# Patient Record
Sex: Male | Born: 1990 | Race: Black or African American | Hispanic: No | Marital: Single | Smoking: Current every day smoker
Health system: Southern US, Community
[De-identification: ages and names within clinical notes are randomized; demographics above are authoritative.]

---
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
## 2012-04-21 ENCOUNTER — Encounter (HOSPITAL_COMMUNITY): Payer: Self-pay | Admitting: *Deleted

## 2012-04-21 ENCOUNTER — Emergency Department (HOSPITAL_COMMUNITY)
Admission: EM | Admit: 2012-04-21 | Discharge: 2012-04-21 | Disposition: A | Discharge status: Home / Self Care | Payer: 59 | Attending: Emergency Medicine | Admitting: Emergency Medicine

## 2012-04-21 DIAGNOSIS — F17200 Nicotine dependence, unspecified, uncomplicated: Secondary | ICD-10-CM | POA: Insufficient documentation

## 2012-04-21 DIAGNOSIS — J029 Acute pharyngitis, unspecified: Secondary | ICD-10-CM | POA: Insufficient documentation

## 2012-04-21 MED ORDER — AMOXICILLIN 500 MG PO CAPS: 500.0000 mg | ORAL_CAPSULE | Freq: Three times a day (TID) | ORAL | Status: AC

## 2012-04-21 NOTE — ED Notes (Signed)
Reports having sore throat x 1 week, airway intact at triage.

## 2012-04-21 NOTE — Discharge Instructions (Signed)
Take ibuprofen or Tylenol as needed for pain. See the Dr. of your choice for problems.   Antibiotic Nonuse  Your caregiver felt that the infection or problem was not one that would be helped with an antibiotic. Infections may be caused by viruses or bacteria. Only a caregiver can tell which one of these is the likely cause of an illness. A cold is the most common cause of infection in both adults and children. A cold is a virus. Antibiotic treatment will have no effect on a viral infection. Viruses can lead to many lost days of work caring for sick children and many missed days of school. Children may catch as many as 10 "colds" or "flus" per year during which they can be tearful, cranky, and uncomfortable. The goal of treating a virus is aimed at keeping the ill person comfortable. Antibiotics are medications used to help the body fight bacterial infections. There are relatively few types of bacteria that cause infections but there are hundreds of viruses. While both viruses and bacteria cause infection they are very different types of germs. A viral infection will typically go away by itself within 7 to 10 days. Bacterial infections may spread or get worse without antibiotic treatment. Examples of bacterial infections are:  Sore throats (like strep throat or tonsillitis).   Infection in the lung (pneumonia).   Ear and skin infections.  Examples of viral infections are:  Colds or flus.   Most coughs and bronchitis.   Sore throats not caused by Strep.   Runny noses.  It is often best not to take an antibiotic when a viral infection is the cause of the problem. Antibiotics can kill off the helpful bacteria that we have inside our body and allow harmful bacteria to start growing. Antibiotics can cause side effects such as allergies, nausea, and diarrhea without helping to improve the symptoms of the viral infection. Additionally, repeated uses of antibiotics can cause bacteria inside of our  body to become resistant. That resistance can be passed onto harmful bacterial. The next time you have an infection it may be harder to treat if antibiotics are used when they are not needed. Not treating with antibiotics allows our own immune system to develop and take care of infections more efficiently. Also, antibiotics will work better for [place] when they are prescribed for bacterial infections. Treatments for a child that is ill may include:  Give extra fluids throughout the day to stay hydrated.   Get plenty of rest.   Only give your child over-the-counter or prescription medicines for pain, discomfort, or fever as directed by your caregiver.   The use of a cool mist humidifier may help stuffy noses.   Cold medications if suggested by your caregiver.  Your caregiver may decide to start you on an antibiotic if:  The problem you were seen for today continues for a longer length of time than expected.   You develop a secondary bacterial infection.  SEEK MEDICAL CARE IF:  Fever lasts longer than 5 days.   Symptoms continue to get worse after 5 to 7 days or become severe.   Difficulty in breathing develops.   Signs of dehydration develop (poor drinking, rare urinating, dark colored urine).   Changes in behavior or worsening tiredness (listlessness or lethargy).  Document Released: 01/08/2002 Document Revised: 10/19/2011 Document Reviewed: 07/07/2009 [facility] Patient Information 2012 [place], [place].Pharyngitis, Viral and Bacterial Pharyngitis is soreness (inflammation) or infection of the pharynx. It is also called a sore throat.  CAUSES  Most sore throats are caused by viruses and are part of a cold. However, some sore throats are caused by strep and other bacteria. Sore throats can also be caused by post nasal drip from draining sinuses, allergies and sometimes from sleeping with an open mouth. Infectious sore throats can be spread from person to person by coughing, sneezing and  sharing cups or eating utensils. TREATMENT  Sore throats that are viral usually last 3-4 days. Viral illness will get better without medications (antibiotics). Strep throat and other bacterial infections will usually begin to get better about 24-48 hours after you begin to take antibiotics. HOME CARE INSTRUCTIONS   If the caregiver feels there is a bacterial infection or if there is a positive strep test, they will prescribe an antibiotic. The full course of antibiotics must be taken. If the full course of antibiotic is not taken, you or your child may become ill again. If you or your child has strep throat and do not finish all of the medication, serious heart or kidney diseases may develop.   Drink enough water and fluids to keep your urine clear or pale yellow.   Only take over-the-counter or prescription medicines for pain, discomfort or fever as directed by your caregiver.   Get lots of rest.   Gargle with salt water ( tsp. of salt in a glass of water) as often as every 1-2 hours as you need for comfort.   Hard candies may soothe the throat if individual is not at risk for choking. Throat sprays or lozenges may also be used.  SEEK MEDICAL CARE IF:   Large, tender lumps in the neck develop.   A rash develops.   Green, yellow-brown or bloody sputum is coughed up.   Your baby is older than 3 months with a rectal temperature of 100.5 F (38.1 C) or higher for more than 1 day.  SEEK IMMEDIATE MEDICAL CARE IF:   A stiff neck develops.   You or your child are drooling or unable to swallow liquids.   You or your child are vomiting, unable to keep medications or liquids down.   You or your child has severe pain, unrelieved with recommended medications.   You or your child are having difficulty breathing (not due to stuffy nose).   You or your child are unable to fully open your mouth.   You or your child develop redness, swelling, or severe pain anywhere on the neck.   You have  a fever.   Your baby is older than 3 months with a rectal temperature of 102 F (38.9 C) or higher.   Your baby is 54 months old or younger with a rectal temperature of 100.4 F (38 C) or higher.  MAKE SURE YOU:   Understand these instructions.   Will watch your condition.   Will get help right away if you are not doing well or get worse.  Document Released: 10/30/2005 Document Revised: 10/19/2011 Document Reviewed: 01/27/2008 [facility] Patient Information 2012 [place], [place].  RESOURCE GUIDE  Chronic Pain Problems: Contact [facility]  [phone] Patients need to be referred by their primary care doctor.  Insufficient Money for Medicine: Contact United Way:  call "211" or [facility] 626-092-6447.  No Primary Care Doctor: - Call [facility]  [phone] - can help you locate a primary care doctor that  accepts your insurance, provides certain services, etc. - Physician Referral [phone]  Agencies that provide inexpensive medical care: -  [facility]  [phone] - [facility]  [phone] - [facility]  [phone] [facility]  [phone] - [facility] Parenthood  [phone] [facility]  [phone]  Medicaid-accepting [facility] Providers: - [facility]- [address]  [phone], Mon-Fri 9am-7pm, Sat 9am-1pm - [facility]- [address] [place], [address] [place]  [phone] - [facility]- [address], [address] [place]  [phone] [facility]- [address]  [phone] - Renaye Rakers- [address] [place], [address], [phone]  Only accepts [place] patients after they have their name  applied to their card  Self Pay (no insurance) in [place]: - Sickle Cell Patients: Dr Willey Blade, [facility]  [address] [place], [phone] - [facility]- [address] [place]  [phone]       [facility]- [address]       -     [facility]- see information above (Speak to [employer] if you do not have insurance)       -  [facility]- [address] [place], [phone]       -  [facility]- [address],  [phone]       -  [facility]- [address] [phone]       -  Dr Julio Sicks-  [address], [place], [phone]       -  [facility]- [address] [phone]       -  [facility]- [address] [phone], also [address], [phone]       -    [facility]- [address] [place], [phone], 1st & 3rd Saturday   every month, 10am-1pm  1) Find a Doctor and Pay Out of Pocket Although you won't have to find out who is covered by your insurance plan, it is a good idea to ask around and get recommendations. You will then need to call the office and see if the doctor you have chosen will accept you as a new patient and what types of options they offer for patients who are self-pay. Some doctors offer discounts or will set up payment plans for their patients who do not have insurance, but you will need to ask so you aren't surprised when you get to your appointment.  2) Contact Your Local Health Department Not all health departments have doctors that can see patients for sick visits, but many do, so it is worth a call to see if yours does. If you don't know where your local health department is, you can check in your phone book. The CDC also has a tool to help you locate your state's health department, and many state websites also have listings of all of their local health departments.  3) Find a Walk-in Clinic If your illness is not likely to be very severe or complicated, you may want to try a walk in clinic. These are popping up all over the country in pharmacies, drugstores, and shopping centers. They're usually  staffed by nurse practitioners or physician assistants that have been trained to treat common illnesses and complaints. They're usually fairly quick and inexpensive. However, if you have serious medical issues  or chronic medical problems, these are probably not your best option  STD Testing - [facility] Department of [facility], STD Clinic, [address], [place], phone [phone] or [phone].  Monday - Friday, call for an appointment. [facility] Department of [employer], STD Clinic, [address], [place], phone [phone] or [phone].  Monday - Friday, call for an appointment.  Abuse/Neglect: [facility] Child Abuse Hotline (713)529-9474 [facility] Child Abuse Hotline (909)424-6735 (After Hours)  Emergency Shelter:  [facility] 7093846377  Maternity Homes: - Room at the [place] [facility] [phone] - [facility] (316)773-0325  MRSA Hotline #:   782-735-4140  [facility] Resources  [facility]  [employer] [facility] Dept. [address]                 [address]         [address] [place] [address]  Blondell Reveal Phone:  [phone]                                  Phone:  [phone]                   Phone:  [phone]  [facility], [phone] - [facility] - [phone]       -     [facility] in [place], [address],                                  ([phone], [facility] Child Abuse Hotline 314 751 7289 or (731) 032-5846 (After Hours)   Behavioral Health Services  Substance Abuse Resources: - [facility]  223-097-5792 - [facility] [phone] - [facility] [phone]) [phone] Floydene Flock  ([phone] - Residential & Outpatient Substance Abuse Program  6828199838  Psychological Services: [facility]  [phone] Services  [phone] - [facility], [phone] [place]. [address] [place], ACCESS LINE: 629-787-4054 or 780-409-4329, [employer]  Dental Assistance  If unable to pay or uninsured, contact:  [facility] or [facility]. to become qualified for the adult dental clinic.  Patients with Medicaid: [facility] [phone] [address], [phone] [address], [phone]  If unable to pay, or uninsured, contact [facility] (819) [phone]) or [facility] Department [phone] in [place], [phone] in [facility]) to become qualified for the adult dental clinic  Other Low-Cost Community Dental Services: - [facility]- [address] [place], [place], [place], [address], [phone], [address]. [phone], 2nd and 4th Thursday of the month at 6:30am.  10 clients each day by appointment, can sometimes see walk-in patients if someone does not show for an appointment. [facility]- [address] [place], [place], [address], [phone] - [facility] [address], [place], [place], [address], [phone] - [facility]- [phone] [facility] Department- [phone] [facility] Department- [phone]

## 2012-04-21 NOTE — ED Provider Notes (Signed)
History   This chart was scribed for Flint Melter, MD scribed by Magnus Sinning. The patient was seen in room STRE4/STRE4 seen  12:43   CSN: [id number]  Arrival date & time 04/21/12  1122   None     Chief Complaint  Patient presents with  . Sore Throat    (Consider location/radiation/quality/duration/timing/severity/associated sxs/prior treatment) HPI Corey Watkins is a 21 y.o. male who presents to the Emergency Department complaining of gradually worsening intermittent moderate ST with associated neck pain, fever, runny nose, and clear yellow-sputum production. Pt says ST it was mild before, but that it has worsened today. Pt states he has taken ibuprofen with temporary mild relief. Denies difficultty breathing, and otalgias.  History reviewed. No pertinent past medical history.  History reviewed. No pertinent past surgical history.  History reviewed. No pertinent family history.  History  Substance Use Topics  . Smoking status: Current Everyday Smoker    Types: Cigarettes  . Smokeless tobacco: Not on file  . Alcohol Use: Yes     occ      Review of Systems 10 Systems reviewed and are negative for acute change except as noted in the HPI. Allergies  Review of patient's allergies indicates no known allergies.  Home Medications   Current Outpatient Rx  Name Route Sig Dispense Refill  . IBUPROFEN 200 MG PO TABS Oral Take 800 mg by mouth every 6 (six) hours as needed. For pain    . AMOXICILLIN 500 MG PO CAPS Oral Take 1 capsule (500 mg total) by mouth 3 (three) times daily. 21 capsule 0    BP 114/56  Pulse 75  Temp(Src) 98.4 F (36.9 C) (Oral)  Resp 18  SpO2 100%  Physical Exam  Nursing note and vitals reviewed. Constitutional: He is oriented to person, place, and time. He appears well-developed and well-nourished. No distress.  HENT:  Head: Normocephalic and atraumatic.  Right Ear: Tympanic membrane normal.  Left Ear: Tympanic membrane normal.   Bilateral tonsil hypertrophy with exudate on left tonsil No peritonsillar swelling.  Eyes: Conjunctivae and EOM are normal.  Neck: Neck supple. No tracheal deviation present.  Cardiovascular: Normal rate.   Pulmonary/Chest: Effort normal. No respiratory distress.  Musculoskeletal: Normal range of motion.  Neurological: He is alert and oriented to person, place, and time.  Skin: Skin is warm and dry.  Psychiatric: He has a normal mood and affect. His behavior is normal.    ED Course  Procedures (including critical care time) DIAGNOSTIC STUDIES: Oxygen Saturation is 100% on room air, normal by my interpretation.    COORDINATION OF CARE:   Labs Reviewed  RAPID STREP SCREEN   No results found.   1. Pharyngitis       MDM  Pharyngitis with likely, infection, not clearly strep. He has been symptomatic for 1 week. We'll cover with antibiotics for occult bacterial infection. Doubt metabolic instability, serious bacterial infection or impending vascular collapse; the patient is stable for discharge.   I personally performed the services described in this documentation, which was scribed in my presence. The recorded information has been reviewed and considered.    Plan: Home Medications- Amoxicillin; Home Treatments- Advil; Recommended follow up- PCP of  Choice prn        Flint Melter, MD 04/21/12 [name]

## 2015-07-03 ENCOUNTER — Encounter (HOSPITAL_COMMUNITY): Payer: Self-pay | Admitting: *Deleted

## 2015-07-03 ENCOUNTER — Emergency Department (HOSPITAL_COMMUNITY)
Admission: EM | Admit: 2015-07-03 | Discharge: 2015-07-03 | Disposition: A | Discharge status: Home / Self Care | Payer: No Typology Code available for payment source | Attending: Emergency Medicine | Admitting: Emergency Medicine

## 2015-07-03 DIAGNOSIS — Y998 Other external cause status: Secondary | ICD-10-CM | POA: Diagnosis not present

## 2015-07-03 DIAGNOSIS — T148XXA Other injury of unspecified body region, initial encounter: Secondary | ICD-10-CM

## 2015-07-03 DIAGNOSIS — Y92410 Unspecified street and highway as the place of occurrence of the external cause: Secondary | ICD-10-CM | POA: Diagnosis not present

## 2015-07-03 DIAGNOSIS — S199XXA Unspecified injury of neck, initial encounter: Secondary | ICD-10-CM | POA: Diagnosis present

## 2015-07-03 DIAGNOSIS — S161XXA Strain of muscle, fascia and tendon at neck level, initial encounter: Secondary | ICD-10-CM | POA: Insufficient documentation

## 2015-07-03 DIAGNOSIS — Y9389 Activity, other specified: Secondary | ICD-10-CM | POA: Diagnosis not present

## 2015-07-03 DIAGNOSIS — Z720 Tobacco use: Secondary | ICD-10-CM | POA: Diagnosis not present
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
## 2015-07-03 NOTE — ED Notes (Signed)
Declined W/C at D/C and was escorted to lobby by RN. 

## 2016-05-31 ENCOUNTER — Encounter (HOSPITAL_COMMUNITY): Payer: Self-pay | Admitting: Emergency Medicine

## 2016-05-31 ENCOUNTER — Emergency Department (HOSPITAL_COMMUNITY)
Admission: EM | Admit: 2016-05-31 | Discharge: 2016-05-31 | Disposition: A | Discharge status: Home / Self Care | Payer: Self-pay | Attending: Emergency Medicine | Admitting: Emergency Medicine

## 2016-05-31 ENCOUNTER — Emergency Department (HOSPITAL_COMMUNITY): Payer: Self-pay

## 2016-05-31 DIAGNOSIS — F17210 Nicotine dependence, cigarettes, uncomplicated: Secondary | ICD-10-CM | POA: Insufficient documentation

## 2016-05-31 DIAGNOSIS — R053 Chronic cough: Secondary | ICD-10-CM

## 2016-05-31 DIAGNOSIS — R05 Cough: Secondary | ICD-10-CM

## 2016-05-31 DIAGNOSIS — J40 Bronchitis, not specified as acute or chronic: Secondary | ICD-10-CM | POA: Insufficient documentation

## 2016-05-31 MED ORDER — ALBUTEROL SULFATE HFA 108 (90 BASE) MCG/ACT IN AERS
2.0000 | INHALATION_SPRAY | RESPIRATORY_TRACT | Status: DC
  Administered 2016-05-31: 2 via RESPIRATORY_TRACT
  Filled 2016-05-31: qty 6.7

## 2016-05-31 NOTE — ED Provider Notes (Signed)
CSN: [id number]     Arrival date & time 05/31/16  0002 History  By signing my name below, I, Bridgette Habermann, attest that this documentation has been prepared under the direction and in the presence of Azalia Bilis, MD. Electronically Signed: Bridgette Habermann, ED Scribe. 05/31/2016. 2:52 AM.   Chief Complaint  Patient presents with  . Cough   The history is provided by the patient. No language interpreter was used.   HPI Comments: Corey Watkins is a 25 y.o. male who presents to the Emergency Department complaining of sudden onset, persistent productive cough onset one week ago. Pt also has associated chest congestion, nasal congestion and headache. No alleviating factors noted. Pt has no h/o asthma. Pt denies difficulty breathing, fever and chills.   History reviewed. No pertinent past medical history. History reviewed. No pertinent past surgical history. No family history on file. Social History  Substance Use Topics  . Smoking status: Current Every Day Smoker    Types: Cigarettes  . Smokeless tobacco: None  . Alcohol Use: Yes     Comment: occ    Review of Systems  A complete 10 system review of systems was obtained and all systems are negative except as noted in the HPI and PMH.   Allergies  Review of patient's allergies indicates no known allergies.  Home Medications   Prior to Admission medications   Not on File   BP 126/54 mmHg  Pulse 60  Temp(Src) 98.5 F (36.9 C) (Oral)  Resp 16  Ht  (1.753 m)  Wt 158 lb (71.668 kg)  BMI 23.32 kg/m2  SpO2 97% Physical Exam  Constitutional: He is oriented to person, place, and time. He appears well-developed and well-nourished.  HENT:  Head: Normocephalic and atraumatic.  Eyes: EOM are normal.  Neck: Normal range of motion.  Cardiovascular: Normal rate, regular rhythm, normal heart sounds and intact distal pulses.   Pulmonary/Chest: Effort normal and breath sounds normal. No respiratory distress.  Abdominal: Soft. He exhibits no  distension. There is no tenderness.  Musculoskeletal: Normal range of motion.  Neurological: He is alert and oriented to person, place, and time.  Skin: Skin is warm and dry.  Psychiatric: He has a normal mood and affect. Judgment normal.  Nursing note and vitals reviewed.   ED Course  Procedures  DIAGNOSTIC STUDIES: Oxygen Saturation is 97% on RA, adequate by my interpretation.    COORDINATION OF CARE: 2:51 AM Discussed treatment plan with pt at bedside which includes Rx inhaler and pt agreed to plan.  Labs Review Labs Reviewed - No data to display  Imaging Review Dg Chest 2 View  05/31/2016  CLINICAL DATA:  Persistent cough.  Congestion for a few days EXAM: CHEST  2 VIEW COMPARISON:  None. FINDINGS: Normal heart size and mediastinal contours. No acute infiltrate or edema. No effusion or pneumothorax. No acute osseous findings. IMPRESSION: Negative chest. Electronically Signed   By: Marnee Spring M.D.   On: 05/31/2016 00:55   I have personally reviewed and evaluated these images and lab results as part of my medical decision-making.   EKG Interpretation None      MDM   Final diagnoses:  Bronchitis    Well-appearing.  Vital signs normal.  Chest x-ray without pneumonia.  Suspect viral bronchitis.  Discharge home with albuterol for cough.  I personally performed the services described in this documentation, which was scribed in my presence. The recorded information has been reviewed and is accurate.  Azalia BilisKevin Tekesha Almgren, MD 05/31/16 [phone]

## 2016-05-31 NOTE — ED Notes (Addendum)
Pt. reports persistent productive cough , chest congestion , headache and nasal congestion onset last week , no fever or chills , respirations unlabored.

## 2016-05-31 NOTE — Discharge Instructions (Signed)
Upper Respiratory Infection, Adult Most upper respiratory infections (URIs) are a viral infection of the air passages leading to the lungs. A URI affects the nose, throat, and upper air passages. The most common type of URI is nasopharyngitis and is typically referred to as "the common cold." URIs run their course and usually go away on their own. Most of the time, a URI does not require medical attention, but sometimes a bacterial infection in the upper airways can follow a viral infection. This is called a secondary infection. Sinus and middle ear infections are common types of secondary upper respiratory infections. Bacterial pneumonia can also complicate a URI. A URI can worsen asthma and chronic obstructive pulmonary disease (COPD). Sometimes, these complications can require emergency medical care and may be life threatening.  CAUSES Almost all URIs are caused by viruses. A virus is a type of germ and can spread from one person to another.  RISKS FACTORS You may be at risk for a URI if:   You smoke.   You have chronic heart or lung disease.  You have a weakened defense (immune) system.   You are very young or very old.   You have nasal allergies or asthma.  You work in crowded or poorly ventilated areas.  You work in health care facilities or schools. SIGNS AND SYMPTOMS  Symptoms typically develop 2-3 days after you come in contact with a cold virus. Most viral URIs last 7-10 days. However, viral URIs from the influenza virus (flu virus) can last 14-18 days and are typically more severe. Symptoms may include:   Runny or stuffy (congested) nose.   Sneezing.   Cough.   Sore throat.   Headache.   Fatigue.   Fever.   Loss of appetite.   Pain in your forehead, behind your eyes, and over your cheekbones (sinus pain).  Muscle aches.  DIAGNOSIS  Your health care provider may diagnose a URI by:  Physical exam.  Tests to check that your symptoms are not due to  another condition such as:  Strep throat.  Sinusitis.  Pneumonia.  Asthma. TREATMENT  A URI goes away on its own with time. It cannot be cured with medicines, but medicines may be prescribed or recommended to relieve symptoms. Medicines may help:  Reduce your fever.  Reduce your cough.  Relieve nasal congestion. HOME CARE INSTRUCTIONS   Take medicines only as directed by your health care provider.   Gargle warm saltwater or take cough drops to comfort your throat as directed by your health care provider.  Use a warm mist humidifier or inhale steam from a shower to increase air moisture. This may make it easier to breathe.  Drink enough fluid to keep your urine clear or pale yellow.   Eat soups and other clear broths and maintain good nutrition.   Rest as needed.   Return to work when your temperature has returned to normal or as your health care provider advises. You may need to stay home longer to avoid infecting others. You can also use a face mask and careful hand washing to prevent spread of the virus.  Increase the usage of your inhaler if you have asthma.   Do not use any tobacco products, including cigarettes, chewing tobacco, or electronic cigarettes. If you need help quitting, ask your health care provider. PREVENTION  The best way to protect yourself from getting a cold is to practice good hygiene.   Avoid oral or hand contact with people with cold   symptoms.   Wash your hands often if contact occurs.  There is no clear evidence that vitamin C, vitamin E, echinacea, or exercise reduces the chance of developing a cold. However, it is always recommended to get plenty of rest, exercise, and practice good nutrition.  SEEK MEDICAL CARE IF:   You are getting worse rather than better.   Your symptoms are not controlled by medicine.   You have chills.  You have worsening shortness of breath.  You have brown or red mucus.  You have yellow or brown nasal  discharge.  You have pain in your face, especially when you bend forward.  You have a fever.  You have swollen neck glands.  You have pain while swallowing.  You have white areas in the back of your throat. SEEK IMMEDIATE MEDICAL CARE IF:   You have severe or persistent:  Headache.  Ear pain.  Sinus pain.  Chest pain.  You have chronic lung disease and any of the following:  Wheezing.  Prolonged cough.  Coughing up blood.  A change in your usual mucus.  You have a stiff neck.  You have changes in your:  Vision.  Hearing.  Thinking.  Mood. MAKE SURE YOU:   Understand these instructions.  Will watch your condition.  Will get help right away if you are not doing well or get worse.   This information is not intended to replace advice given to you by your health care provider. Make sure you discuss any questions you have with your health care provider.   Document Released: 04/25/2001 Document Revised: 03/16/2015 Document Reviewed: 02/04/2014 Elsevier Interactive Patient Education 2016 [employer]  

## 2016-10-03 ENCOUNTER — Emergency Department (HOSPITAL_COMMUNITY): Payer: 59

## 2016-10-03 ENCOUNTER — Encounter (HOSPITAL_COMMUNITY): Payer: Self-pay | Admitting: Emergency Medicine

## 2016-10-03 ENCOUNTER — Emergency Department (HOSPITAL_COMMUNITY)
Admission: EM | Admit: 2016-10-03 | Discharge: 2016-10-03 | Disposition: A | Discharge status: Home / Self Care | Payer: 59 | Attending: Emergency Medicine | Admitting: Emergency Medicine

## 2016-10-03 DIAGNOSIS — M79671 Pain in right foot: Secondary | ICD-10-CM

## 2016-10-03 DIAGNOSIS — Y9289 Other specified places as the place of occurrence of the external cause: Secondary | ICD-10-CM | POA: Insufficient documentation

## 2016-10-03 DIAGNOSIS — Y9301 Activity, walking, marching and hiking: Secondary | ICD-10-CM | POA: Insufficient documentation

## 2016-10-03 DIAGNOSIS — Y999 Unspecified external cause status: Secondary | ICD-10-CM | POA: Insufficient documentation

## 2016-10-03 DIAGNOSIS — F17210 Nicotine dependence, cigarettes, uncomplicated: Secondary | ICD-10-CM | POA: Diagnosis not present

## 2016-10-03 DIAGNOSIS — X58XXXA Exposure to other specified factors, initial encounter: Secondary | ICD-10-CM | POA: Insufficient documentation

## 2016-10-03 MED ORDER — LIDOCAINE 5 % EX PTCH
1.0000 | MEDICATED_PATCH | CUTANEOUS | Status: DC
  Administered 2016-10-03: 1 via TRANSDERMAL
  Filled 2016-10-03: qty 1

## 2016-10-03 MED ORDER — KETOROLAC TROMETHAMINE 60 MG/2ML IM SOLN
60.0000 mg | Freq: Once | INTRAMUSCULAR | Status: AC
  Administered 2016-10-03: 60 mg via INTRAMUSCULAR
  Filled 2016-10-03: qty 2

## 2016-10-03 NOTE — ED Triage Notes (Signed)
Pt from home. Sts he went walking his dog last night and believes he might have moved his foot wrong. C/o pain to R foot. Pt sts he took Tylenol @ home w/ no relief. Rates pain @ 10/10. A&O x4 on arrival.

## 2016-10-03 NOTE — ED Provider Notes (Signed)
[facility]-EMERGENCY DEPT Provider Note   CSN: [id number] Arrival date & time: 10/03/16  0334     History   Chief Complaint Chief Complaint  Patient presents with  . Foot Pain    HPI Corey Watkins is a 25 y.o. male with no significant past medical history presenting today with right foot pain. Patient states he was playing with his dog and he is unsure what happened after that. He denies twisting his ankle or falling. He states his foot STARTED HURTING. THIS CONTINUED TO GET WORSE THROUGHOUT THE NIGHT. HE DENIES ANY INJURY TO THIS AREA IN THE PAST. HE HAS NOT TAKEN ANY MEDICATIONS TO MAKE THIS BETTER. THERE ARE NO FURTHER COMPLAINTS. PATIENT STATES HE CANNOT WALK ON IT, THIS MAKES THE PAIN WORSE.  HPI  History reviewed. No pertinent past medical history.  There are no active problems to display for this patient.   History reviewed. No pertinent surgical history.     Home Medications    Prior to Admission medications   Not on File    Family History History reviewed. No pertinent family history.  Social History Social History  Substance Use Topics  . Smoking status: Current Every Day Smoker    Types: Cigarettes  . Smokeless tobacco: Never Used  . Alcohol use Yes     Comment: occ     Allergies   Patient has no known allergies.   Review of Systems Review of Systems 10 Systems reviewed and are negative for acute change except as noted in the HPI.   Physical Exam Updated Vital Signs BP 132/99 (BP Location: Right Arm)   Pulse 72   Temp 98.4 F (36.9 C) (Oral)   Resp 20   Ht 5\' 9"  (1.753 m)   Wt 160 lb (72.6 kg)   SpO2 95%   BMI 23.63 kg/m   Physical Exam  Constitutional: He is oriented to person, place, and time. Vital signs are normal. He appears well-developed and well-nourished.  Non-toxic appearance. He does not appear ill. No distress.  HENT:  Head: Normocephalic and atraumatic.  Nose: Nose normal.  Mouth/Throat: Oropharynx is clear and moist. No  oropharyngeal exudate.  Eyes: Conjunctivae and EOM are normal. Pupils are equal, round, and reactive to light. No scleral icterus.  Neck: Normal range of motion. Neck supple. No tracheal deviation, no edema, no erythema and normal range of motion present. No thyroid mass and no thyromegaly present.  Cardiovascular: Normal rate, regular rhythm, S1 normal, S2 normal, normal heart sounds, intact distal pulses and normal pulses.  Exam reveals no gallop and no friction rub.   No murmur heard. Pulmonary/Chest: Effort normal and breath sounds normal. No respiratory distress. He has no wheezes. He has no rhonchi. He has no rales.  Abdominal: Soft. Normal appearance and bowel sounds are normal. He exhibits no distension, no ascites and no mass. There is no hepatosplenomegaly. There is no tenderness. There is no rebound, no guarding and no CVA tenderness.  Musculoskeletal: Normal range of motion. He exhibits tenderness. He exhibits no edema or deformity.  There is slight swelling to the dorsal surface of the proximal foot. There is no obvious dislocations. There is tenderness to light touch. Normal range of motion.  Lymphadenopathy:    He has no cervical adenopathy.  Neurological: He is alert and oriented to person, place, and time. He has normal strength. No cranial nerve deficit or sensory deficit.  Skin: Skin is warm, dry and intact. No petechiae and no rash noted. He is  not diaphoretic. No erythema. No pallor.  Nursing note and vitals reviewed.    ED Treatments / Results  Labs (all labs ordered are listed, but only abnormal results are displayed) Labs Reviewed - No data to display  EKG  EKG Interpretation None       Radiology Dg Foot Complete Right  Result Date: 10/03/2016 CLINICAL DATA:  Pain across the top of the foot after twisting injury tonight. EXAM: RIGHT FOOT COMPLETE - 3+ VIEW COMPARISON:  None. FINDINGS: There is no evidence of fracture or dislocation. There is no evidence of  arthropathy or other focal bone abnormality. Soft tissues are unremarkable. IMPRESSION: Negative. Electronically Signed   By: Burman NievesWilliam  Stevens M.D.   On: 10/03/2016 04:12    Procedures Procedures (including critical care time)  Medications Ordered in ED Medications  lidocaine (LIDODERM) 5 % 1 patch (1 patch Transdermal Patch Applied 10/03/16 0503)  ketorolac (TORADOL) injection 60 mg (60 mg Intramuscular Given 10/03/16 0441)     Initial Impression / Assessment and Plan / ED Course  I have reviewed the triage vital signs and the nursing notes.  Pertinent labs & imaging results that were available during my care of the patient were reviewed by me and considered in my medical decision making (see chart for details).  Clinical Course     patient presents to the emergency department for right foot pain. No injury noted on history. We'll obtained x-ray and give Toradol injection for pain control. Also give lidocaine patch.   5:52 AM XR negative for any injury.  Patient advised to see ortho for close follow up.  Tylenol and ibuprofen at home as needed.  Rec for ice packs and leg elevation.  He appears well and in NAD.  Ace wrap applied and crutches provided.  VS remain within his normal limits and he is safe for DC.  Final Clinical Impressions(s) / ED Diagnoses   Final diagnoses:  Foot pain, right    New Prescriptions New Prescriptions   No medications on file     Tomasita CrumbleAdeleke Kenora Spayd, MD 10/03/16 [phone]

## 2017-05-13 IMAGING — CR DG FOOT COMPLETE 3+V*R*
3 series · 3 of 3 positions shown · non-contrast
Comparison: None.

CLINICAL DATA: Pain across the top of the foot after twisting
injury tonight.

EXAM:
RIGHT FOOT COMPLETE - 3+ VIEW

[foot ap]
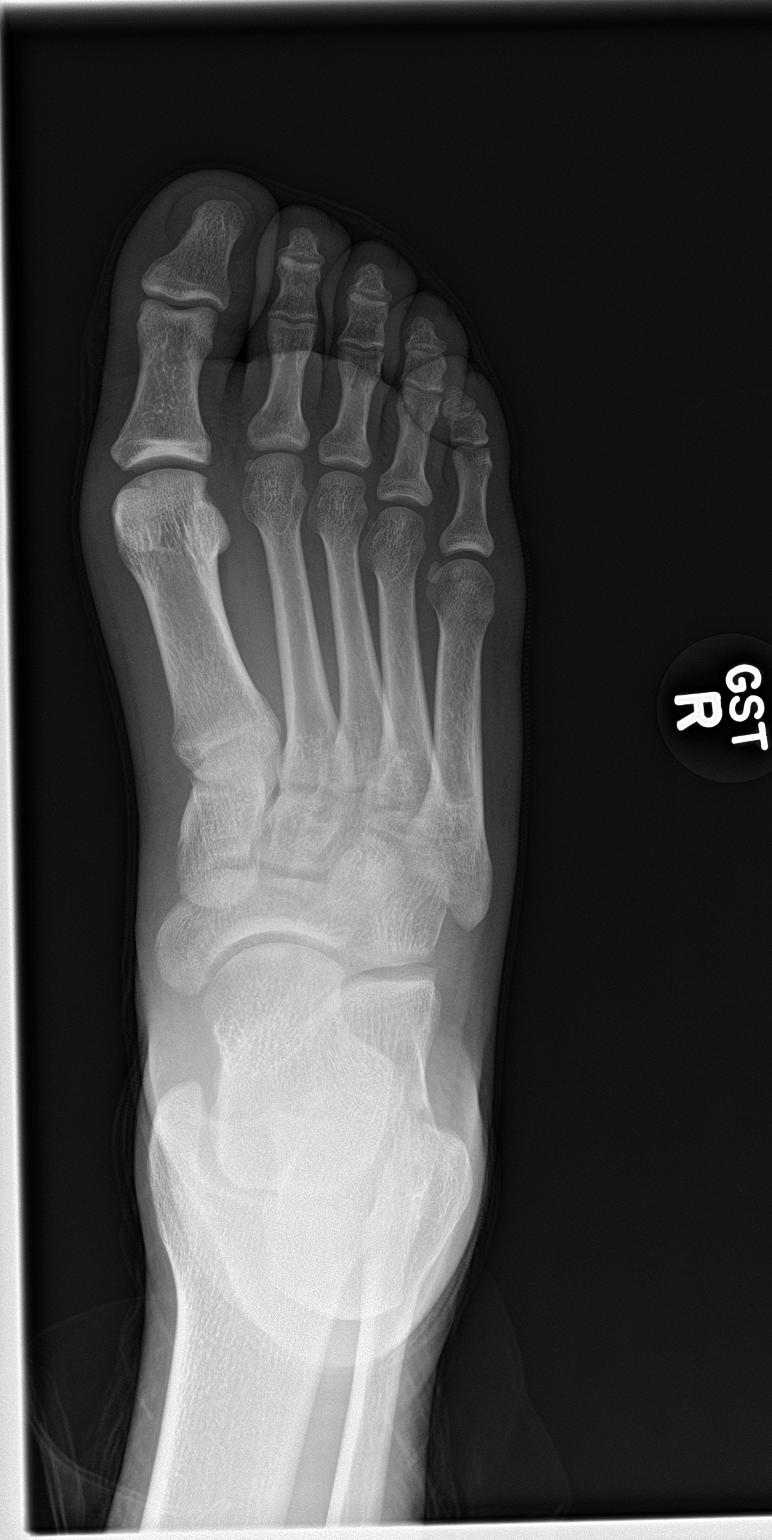

[foot obl]
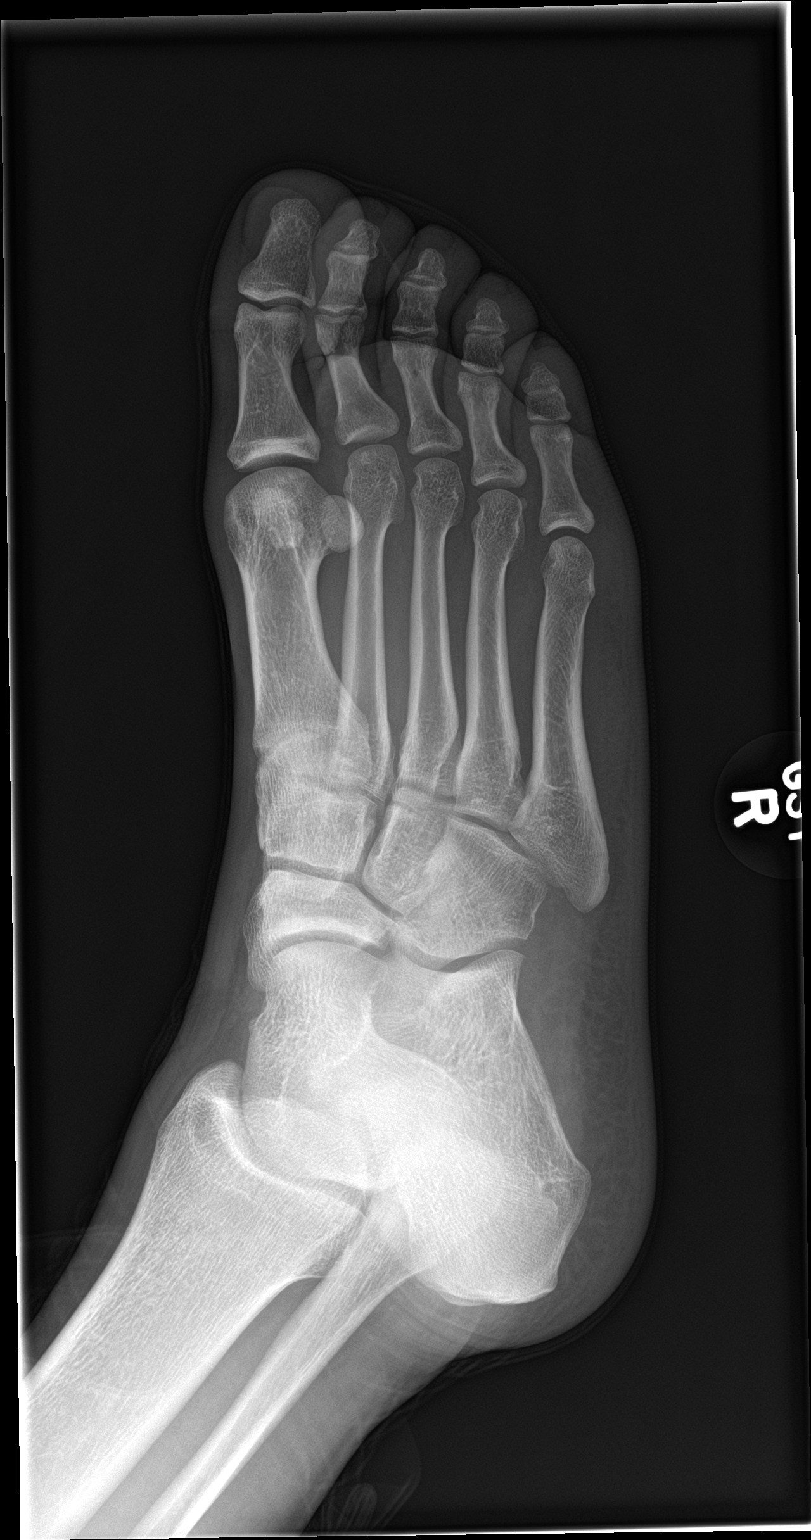

[foot lat]
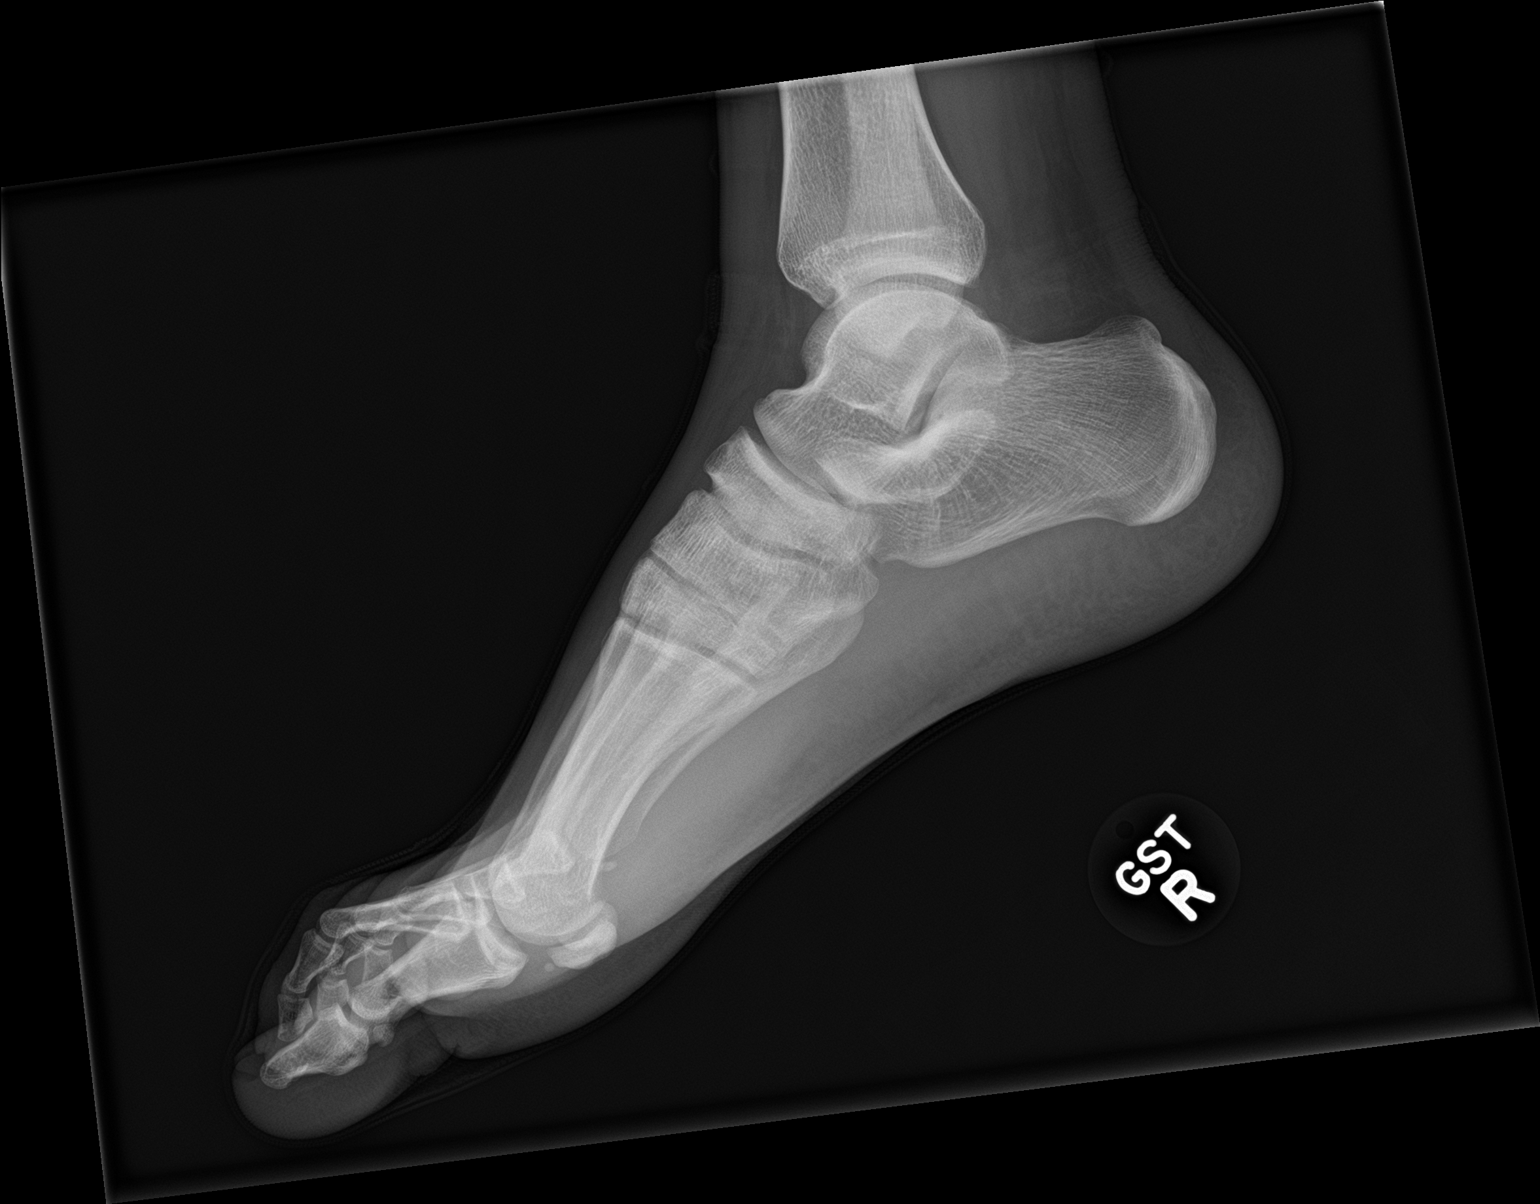

[3 of 3 positions shown; findings below may reference images not displayed]

FINDINGS: There is no evidence of fracture or dislocation. There is no
evidence of arthropathy or other focal bone abnormality. Soft
tissues are unremarkable.
IMPRESSION: Negative.

## 2017-07-10 ENCOUNTER — Ambulatory Visit (HOSPITAL_COMMUNITY)
Admission: EM | Admit: 2017-07-10 | Discharge: 2017-07-10 | Disposition: A | Discharge status: Home / Self Care | Payer: 59 | Attending: Emergency Medicine | Admitting: Emergency Medicine

## 2017-07-10 ENCOUNTER — Encounter (HOSPITAL_COMMUNITY): Payer: Self-pay | Admitting: Family Medicine

## 2017-07-10 DIAGNOSIS — S81812A Laceration without foreign body, left lower leg, initial encounter: Secondary | ICD-10-CM

## 2017-07-10 DIAGNOSIS — W25XXXA Contact with sharp glass, initial encounter: Secondary | ICD-10-CM

## 2017-07-10 MED ORDER — BACITRACIN ZINC 500 UNIT/GM EX OINT
TOPICAL_OINTMENT | Freq: Once | CUTANEOUS | Status: AC
  Administered 2017-07-10: 17:00:00 via TOPICAL

## 2017-07-10 MED ORDER — BACITRACIN ZINC 500 UNIT/GM EX OINT
TOPICAL_OINTMENT | CUTANEOUS | Status: AC
  Filled 2017-07-10: qty 0.9

## 2017-07-10 NOTE — ED Provider Notes (Signed)
  [facility]   [id number] 07/10/17 Arrival Time: 1459   SUBJECTIVE:  Corey Watkins is a 26 y.o. male who presents to the urgent care with complaint of laceration to his left leg that occurred last night. States she was walking outside when he scraped his leg against some broken glass, as the area bandaged, reports having a tetanus shot 3 years ago, he is able to walk, states he's in minimal pain.  ROS: As per HPI, remainder of ROS negative.   OBJECTIVE:  Vitals:   07/10/17 1553  BP: (!) 116/58  Pulse: 73  Resp: 18  Temp: 98.3 F (36.8 C)  SpO2: 100%     General appearance: alert; no distress HEENT: normocephalic; atraumatic; conjunctivae normal;  Musculoskeletal: No deformities noted, pulses intact distally, no loss of sensory function Skin: warm and dry, approximately 3 cm laceration distal portion of the left lower leg anterior surface. Extending through the dermis, no involvement of any underlying fascia or tendons Neurologic: Grossly normal Psychological:  alert and cooperative; normal mood and affect     ASSESSMENT & PLAN:  1. Laceration of left lower extremity, initial encounter     Meds ordered this encounter  Medications  . bacitracin ointment    Reviewed expectations re: course of current medical issues. Questions answered. Outlined signs and symptoms indicating need for more acute intervention. Patient verbalized understanding. After Visit Summary given.    Procedures:   Wound location: Left lower leg anterior surface Size: 3 cm  Verbal consent obtained. Patient provided with risks and alternatives to the procedure. Wound copiously irrigated with NS then cleansed with betadine. Anesthetized with 5 mL of lidocaine with epinephrine. Wound carefully explored. No foreign body, tendon injury, or nonviable tissue were noted. Using sterile technique 7 interrupted 3-0 Ethilon Prolene sutures were placed to reapproximate the wound. Patient tolerated  procedure well. No complications. Minimal bleeding. Patient advised to look for and return for any signs of infection such as redness, swelling, discharge, or worsening pain. Return for suture removal within 7 days.     Results for orders placed or performed during the hospital encounter of 04/21/12  Rapid strep screen  Result Value Ref Range   Streptococcus, Group A Screen (Direct) NEGATIVE NEGATIVE    Labs Reviewed - No data to display  No results found.  No Known Allergies  PMHx, SurgHx, SocialHx, Medications, and Allergies were reviewed in the Visit Navigator and updated as appropriate.       Dorena Bodo, NP 07/10/17 2113

## 2017-07-10 NOTE — Discharge Instructions (Signed)
Your wound has been sutured, and 7 sutures have been placed. Keep your wound clean, dry, change her bandages at least once a day, he can apply antibiotic ointment daily. Avoid strenuous activity, as this area the skin is under a great deal of tension. If you see any signs or symptoms of infection, return to clinic as needed otherwise, return in 14 days for removal

## 2017-07-10 NOTE — ED Triage Notes (Signed)
Pt here for LLE laceration that occurred last night. sts that he was carrying the trash out and a piece of glass cut his leg.

## 2017-07-26 ENCOUNTER — Encounter (HOSPITAL_COMMUNITY): Payer: Self-pay | Admitting: Family Medicine

## 2017-07-26 ENCOUNTER — Ambulatory Visit (HOSPITAL_COMMUNITY)
Admission: EM | Admit: 2017-07-26 | Discharge: 2017-07-26 | Disposition: A | Discharge status: Home / Self Care | Payer: Self-pay

## 2017-07-26 MED ORDER — BACITRACIN ZINC 500 UNIT/GM EX OINT
TOPICAL_OINTMENT | CUTANEOUS | Status: AC
  Filled 2017-07-26: qty 0.9

## 2017-07-26 NOTE — ED Triage Notes (Signed)
Pt here for suture removal

## 2020-01-05 ENCOUNTER — Ambulatory Visit: Payer: Self-pay | Admitting: Family Medicine

## 2020-01-05 DIAGNOSIS — Z0289 Encounter for other administrative examinations: Secondary | ICD-10-CM

## 2020-01-05 NOTE — Progress Notes (Deleted)
    Subjective:    CC: R wrist  I, Corey Watkins, LAT, ATC, am serving as scribe for Dr. Clementeen Graham.  HPI: Pt is a 29 y/o male presenting w/ c/o R wrist pain x .  He rates his pain at a /10 and describes his pain as .  Radiating pain: Mechanical R wrist symptoms: Swelling: Aggravating factors: Treatments tried:  Pertinent review of Systems: ***  Relevant historical information: ***   Objective:   There were no vitals filed for this visit. General: Well Developed, well nourished, and in no acute distress.   MSK: ***  Lab and Radiology Results No results found for this or any previous visit (from the past 72 hour(s)). No results found.    Impression and Recommendations:    Assessment and Plan: 29 y.o. male with ***.  PDMP not reviewed this encounter. No orders of the defined types were placed in this encounter.  No orders of the defined types were placed in this encounter.   Discussed warning signs or symptoms. Please see discharge instructions. Patient expresses understanding.   ***

## 2020-06-21 ENCOUNTER — Other Ambulatory Visit: Payer: Self-pay

## 2020-06-21 ENCOUNTER — Ambulatory Visit (HOSPITAL_COMMUNITY)
Admission: EM | Admit: 2020-06-21 | Discharge: 2020-06-21 | Disposition: A | Discharge status: Home / Self Care | Payer: Self-pay | Attending: Physician Assistant | Admitting: Physician Assistant

## 2020-06-21 ENCOUNTER — Encounter (HOSPITAL_COMMUNITY): Payer: Self-pay

## 2020-06-21 DIAGNOSIS — Z202 Contact with and (suspected) exposure to infections with a predominantly sexual mode of transmission: Secondary | ICD-10-CM | POA: Insufficient documentation

## 2020-06-21 DIAGNOSIS — N342 Other urethritis: Secondary | ICD-10-CM | POA: Insufficient documentation

## 2020-06-21 MED ORDER — CEFTRIAXONE SODIUM 500 MG IJ SOLR
500.0000 mg | Freq: Once | INTRAMUSCULAR | Status: AC
  Administered 2020-06-21: 500 mg via INTRAMUSCULAR

## 2020-06-21 MED ORDER — DOXYCYCLINE HYCLATE 100 MG PO CAPS: 100.0000 mg | ORAL_CAPSULE | Freq: Two times a day (BID) | ORAL | 0 refills | Status: AC

## 2020-06-21 NOTE — Discharge Instructions (Signed)
Take the medication as prescribed -take with plenty of water - do not lay down within 30 minutes of taking  Abstain from sex until all tests and medications are complete

## 2020-06-21 NOTE — ED Provider Notes (Signed)
[facility]    CSN: [id number] Arrival date & time: 06/21/20  1923      History   Chief Complaint Chief Complaint  Patient presents with  . Exposure to STD    HPI Corey Watkins is a 29 y.o. male.   Patient reports for exposure to STD.  He reports for the last 2 weeks he has had painful urination.  Denies discharge.  Was told by partner and exposure to chlamydia.  Denies testicular pain.  Denies abdominal pain.  No fevers or chills.     History reviewed. No pertinent past medical history.  There are no problems to display for this patient.   History reviewed. No pertinent surgical history.     Home Medications    Prior to Admission medications   Medication Sig Start Date End Date Taking? Authorizing Provider  doxycycline (VIBRAMYCIN) 100 MG capsule Take 1 capsule (100 mg total) by mouth 2 (two) times daily for 7 days. 06/21/20 06/28/20  Kiegan Macaraeg, Veryl Speak, PA-C    Family History History reviewed. No pertinent family history.  Social History Social History   Tobacco Use  . Smoking status: Current Every Day Smoker    Types: Cigarettes  . Smokeless tobacco: Never Used  Substance Use Topics  . Alcohol use: Yes    Comment: occ  . Drug use: Yes    Types: Marijuana     Allergies   No known allergies   Review of Systems Review of Systems   Physical Exam Triage Vital Signs ED Triage Vitals  Enc Vitals Group     BP 06/21/20 2037 127/67     Pulse Rate 06/21/20 2037 70     Resp 06/21/20 2037 16     Temp 06/21/20 2037 98.7 F (37.1 C)     Temp Source 06/21/20 2037 Oral     SpO2 06/21/20 2037 100 %     Weight --      Height --      Head Circumference --      Peak Flow --      Pain Score 06/21/20 2050 0     Pain Loc --      Pain Edu? --      Excl. in GC? --    No data found.  Updated Vital Signs BP 127/67 (BP Location: Left Arm)   Pulse 70   Temp 98.7 F (37.1 C) (Oral)   Resp 16   SpO2 100%   Visual Acuity Right Eye Distance:   Left  Eye Distance:   Bilateral Distance:    Right Eye Near:   Left Eye Near:    Bilateral Near:     Physical Exam Vitals and nursing note reviewed.  Genitourinary:    Comments: Patient deferred exam for self swab.     UC Treatments / Results  Labs (all labs ordered are listed, but only abnormal results are displayed) Labs Reviewed  CYTOLOGY, (ORAL, ANAL, URETHRAL) ANCILLARY ONLY    EKG   Radiology No results found.  Procedures Procedures (including critical care time)  Medications Ordered in UC Medications  cefTRIAXone (ROCEPHIN) injection 500 mg (has no administration in time range)    Initial Impression / Assessment and Plan / UC Course  I have reviewed the triage vital signs and the nursing notes.  Pertinent labs & imaging results that were available during my care of the patient were reviewed by me and considered in my medical decision making (see chart for details).     #  Urethritis Patient is one 30 year old male presenting with urethritis and exposure to STD.  Rocephin in clinic.  Start with doxycycline.  Safe sex discussed.  Cytology sent.  Patient declined HIV and RPR.  Patient verbalized understanding plan of care. Final Clinical Impressions(s) / UC Diagnoses   Final diagnoses:  Urethritis  Possible exposure to STD     Discharge Instructions     Take the medication as prescribed -take with plenty of water - do not lay down within 30 minutes of taking  Abstain from sex until all tests and medications are complete      ED Prescriptions    Medication Sig Dispense Auth. Provider   doxycycline (VIBRAMYCIN) 100 MG capsule Take 1 capsule (100 mg total) by mouth 2 (two) times daily for 7 days. 14 capsule Karlina Suares, Veryl Speak, PA-C     PDMP not reviewed this encounter.   Hermelinda Medicus, PA-C 06/21/20 2111

## 2020-06-21 NOTE — ED Triage Notes (Signed)
Pt c/o mild discomfort when urinating for approx 2-3 weeks. States his girlfriend informed him she was positive for chlamydia. Denies, fever, chills, n/v

## 2020-06-23 LAB — CYTOLOGY, (ORAL, ANAL, URETHRAL) ANCILLARY ONLY
Chlamydia: POSITIVE — AB
Comment: NEGATIVE
Comment: NEGATIVE
Comment: NORMAL
Neisseria Gonorrhea: NEGATIVE
Trichomonas: NEGATIVE

## 2020-08-08 ENCOUNTER — Ambulatory Visit (HOSPITAL_COMMUNITY)
Admission: EM | Admit: 2020-08-08 | Discharge: 2020-08-08 | Disposition: A | Discharge status: Home / Self Care | Payer: Self-pay | Attending: Urgent Care | Admitting: Urgent Care

## 2020-08-08 ENCOUNTER — Other Ambulatory Visit: Payer: Self-pay

## 2020-08-08 ENCOUNTER — Encounter (HOSPITAL_COMMUNITY): Payer: Self-pay

## 2020-08-08 DIAGNOSIS — Z113 Encounter for screening for infections with a predominantly sexual mode of transmission: Secondary | ICD-10-CM | POA: Insufficient documentation

## 2020-08-08 NOTE — ED Provider Notes (Signed)
°  [facility]   MRN: [id number] DOB: Jul 29, 1991  Subjective:   Corey Watkins is a 29 y.o. male presenting for STI screening.  Tested positive for chlamydia June 21, 2020.  Was treated with doxycycline.  He is very concerned now about getting checked regularly.  Had completed the treatment in August and got retested including HIV and syphilis that was negative.  He would like to make sure he gets checked again today for gonorrhea, chlamydia, trichomonas. Denies dysuria, hematuria, urinary frequency, penile discharge, penile swelling, testicular pain, testicular swelling, anal pain, groin pain.   No current facility-administered medications for this encounter. No current outpatient medications on file.   Allergies  Allergen Reactions   No Known Allergies     History reviewed. No pertinent past medical history.   History reviewed. No pertinent surgical history.  History reviewed. No pertinent family history.  Social History   Tobacco Use   Smoking status: Current Every Day Smoker    Types: Cigarettes   Smokeless tobacco: Never Used  Substance Use Topics   Alcohol use: Yes    Comment: occ   Drug use: Yes    Types: Marijuana    ROS   Objective:   Vitals: BP 131/66 (BP Location: Left Arm)    Pulse 67    Temp 99 F (37.2 C) (Oral)    Resp 16    SpO2 100%   Physical Exam Constitutional:      General: He is not in acute distress.    Appearance: Normal appearance. He is well-developed and normal weight. He is not ill-appearing, toxic-appearing or diaphoretic.  HENT:     Head: Normocephalic and atraumatic.     Right Ear: External ear normal.     Left Ear: External ear normal.     Nose: Nose normal.     Mouth/Throat:     Pharynx: Oropharynx is clear.  Eyes:     General: No scleral icterus.       Right eye: No discharge.        Left eye: No discharge.     Extraocular Movements: Extraocular movements intact.     Pupils: Pupils are equal, round,  and reactive to light.  Cardiovascular:     Rate and Rhythm: Normal rate.  Pulmonary:     Effort: Pulmonary effort is normal.  Musculoskeletal:     Cervical back: Normal range of motion.  Neurological:     Mental Status: He is alert and oriented to person, place, and time.  Psychiatric:        Mood and Affect: Mood normal.        Behavior: Behavior normal.        Thought Content: Thought content normal.        Judgment: Judgment normal.       Assessment and Plan :   PDMP not reviewed this encounter.  1. Screen for STD (sexually transmitted disease)     Counseled against empiric treatment today, discussed antibiotic stewardship.  Recommended waiting for results for further treatment.  Patient was agreeable to this.   Wallis Bamberg, [place] 08/08/20 1635

## 2020-08-08 NOTE — ED Triage Notes (Signed)
Pt presents for STD's testing. Reports he had unprotected sex. Denies dysuria, penile discharge, fever.

## 2020-08-10 LAB — CYTOLOGY, (ORAL, ANAL, URETHRAL) ANCILLARY ONLY
Chlamydia: NEGATIVE
Comment: NEGATIVE
Comment: NEGATIVE
Comment: NORMAL
Neisseria Gonorrhea: NEGATIVE
Trichomonas: NEGATIVE

## 2020-12-01 ENCOUNTER — Other Ambulatory Visit: Payer: Self-pay

## 2020-12-01 ENCOUNTER — Encounter (HOSPITAL_COMMUNITY): Payer: Self-pay | Admitting: Emergency Medicine

## 2020-12-01 ENCOUNTER — Ambulatory Visit (HOSPITAL_COMMUNITY)
Admission: EM | Admit: 2020-12-01 | Discharge: 2020-12-01 | Disposition: A | Discharge status: Home / Self Care | Payer: Self-pay | Attending: Family Medicine | Admitting: Family Medicine

## 2020-12-01 DIAGNOSIS — Z202 Contact with and (suspected) exposure to infections with a predominantly sexual mode of transmission: Secondary | ICD-10-CM | POA: Insufficient documentation

## 2020-12-01 DIAGNOSIS — N4889 Other specified disorders of penis: Secondary | ICD-10-CM | POA: Insufficient documentation

## 2020-12-01 MED ORDER — LIDOCAINE HCL (PF) 1 % IJ SOLN
INTRAMUSCULAR | Status: AC
Start: 2020-12-01 — End: ?
  Filled 2020-12-01: qty 2

## 2020-12-01 MED ORDER — CEFTRIAXONE SODIUM 500 MG IJ SOLR
500.0000 mg | Freq: Once | INTRAMUSCULAR | Status: AC
  Administered 2020-12-01: 500 mg via INTRAMUSCULAR

## 2020-12-01 MED ORDER — DOXYCYCLINE HYCLATE 100 MG PO CAPS: 100.0000 mg | ORAL_CAPSULE | Freq: Two times a day (BID) | ORAL | 0 refills | Status: AC

## 2020-12-01 MED ORDER — CEFTRIAXONE SODIUM 500 MG IJ SOLR
INTRAMUSCULAR | Status: AC
Start: 2020-12-01 — End: ?
  Filled 2020-12-01: qty 500

## 2020-12-01 NOTE — ED Provider Notes (Signed)
[facility]   [id number] 12/01/20 Arrival Time: 1603  ASSESSMENT & PLAN:  1. Penile irritation   2. Exposure to STD       Discharge Instructions     You have been given the following today for treatment of suspected gonorrhea and/or chlamydia:  cefTRIAXone (ROCEPHIN) injection 500 mg  Please pick up your prescription for doxycycline 100 mg and begin taking twice daily for the next seven (7) days.  Even though we have treated you today, we have sent testing for sexually transmitted infections. We will notify you of any positive results once they are received. If required, we will prescribe any medications you might need.  Please refrain from all sexual activity for at least the next seven days.     Pending: Labs Reviewed  CYTOLOGY, (ORAL, ANAL, URETHRAL) ANCILLARY ONLY    Will notify of any positive results. Instructed to refrain from sexual activity for at least seven days.  Reviewed expectations re: course of current medical issues. Questions answered. Outlined signs and symptoms indicating need for more acute intervention. Patient verbalized understanding. After Visit Summary given.   SUBJECTIVE:  Corey Watkins is a 30 y.o. male who presents with complaint of penile "irritation". Onset gradual. First noticed few d ago. No specific discharge. No specific aggravating or alleviating factors reported. Denies: urinary frequency, dysuria and gross hematuria. Afebrile. No abdominal or pelvic pain. No n/v. No rashes or lesions. Reports that he is sexually active; reports partner tested + for Chlamydia. OTC treatment: none. .   OBJECTIVE:  Vitals:   12/01/20 1651 12/01/20 1652  BP:  135/63  Pulse:  66  Resp:  16  Temp:  98.8 F (37.1 C)  TempSrc:  Oral  SpO2:  100%  Weight: 77.1 kg   Height: 5\' 9"  (1.753 m)      General appearance: alert, cooperative, appears stated age and no distress Throat: lips, mucosa, and tongue normal; teeth and gums  normal Lungs: unlabored respirations; speaks full sentences without difficulty Back: no CVA tenderness; FROM at waist Abdomen: soft, non-tender GU: deferred Skin: warm and dry Psychological: alert and cooperative; normal mood and affect.    Labs Reviewed  CYTOLOGY, (ORAL, ANAL, URETHRAL) ANCILLARY ONLY    Allergies  Allergen Reactions  . No Known Allergies     History reviewed. No pertinent past medical history. History reviewed. No pertinent family history. Social History   Socioeconomic History  . Marital status: Single    Spouse name: Not on file  . Number of children: Not on file  . Years of education: Not on file  . Highest education level: Not on file  Occupational History  . Not on file  Tobacco Use  . Smoking status: Current Every Day Smoker    Types: Cigarettes  . Smokeless tobacco: Never Used  Substance and Sexual Activity  . Alcohol use: Yes    Comment: occ  . Drug use: Yes    Types: Marijuana  . Sexual activity: Yes    Birth control/protection: None  Other Topics Concern  . Not on file  Social History Narrative  . Not on file   Social Determinants of Health   Financial Resource Strain: Not on file  Food Insecurity: Not on file  Transportation Needs: Not on file  Physical Activity: Not on file  Stress: Not on file  Social Connections: Not on file  Intimate Partner Violence: Not on file          , MD 12/01/20 1742

## 2020-12-01 NOTE — Discharge Instructions (Signed)

## 2020-12-01 NOTE — ED Triage Notes (Signed)
Patient presents for STD testing.   Patient endorses some penile irritation at the tip of penis.   Patient endorsed being exposed to an individual with Chlamydia.  Patient denies penile drainage, ABD pain , or urinary problems.

## 2020-12-03 LAB — CYTOLOGY, (ORAL, ANAL, URETHRAL) ANCILLARY ONLY
Chlamydia: NEGATIVE
Comment: NEGATIVE
Comment: NEGATIVE
Comment: NORMAL
Neisseria Gonorrhea: NEGATIVE
Trichomonas: NEGATIVE

## 2021-04-22 ENCOUNTER — Encounter (HOSPITAL_COMMUNITY): Payer: Self-pay

## 2021-04-22 ENCOUNTER — Other Ambulatory Visit: Payer: Self-pay

## 2021-04-22 ENCOUNTER — Ambulatory Visit (HOSPITAL_COMMUNITY)
Admission: EM | Admit: 2021-04-22 | Discharge: 2021-04-22 | Disposition: A | Discharge status: Home / Self Care | Payer: Self-pay | Attending: Physician Assistant | Admitting: Physician Assistant

## 2021-04-22 DIAGNOSIS — R197 Diarrhea, unspecified: Secondary | ICD-10-CM | POA: Insufficient documentation

## 2021-04-22 DIAGNOSIS — R519 Headache, unspecified: Secondary | ICD-10-CM | POA: Insufficient documentation

## 2021-04-22 DIAGNOSIS — Z20822 Contact with and (suspected) exposure to covid-19: Secondary | ICD-10-CM | POA: Insufficient documentation

## 2021-04-22 DIAGNOSIS — F17210 Nicotine dependence, cigarettes, uncomplicated: Secondary | ICD-10-CM | POA: Insufficient documentation

## 2021-04-22 LAB — SARS CORONAVIRUS 2 (TAT 6-24 HRS): SARS Coronavirus 2: NEGATIVE

## 2021-04-22 NOTE — Discharge Instructions (Addendum)
Take imodium for diarrhea. Tylenol for headache.

## 2021-04-22 NOTE — ED Triage Notes (Signed)
Pt reports headache, nausea and diarrhea x 3 days. Excedrin gives no relief.

## 2021-04-22 NOTE — ED Provider Notes (Addendum)
[facility]    CSN: [id number] Arrival date & time: 04/22/21  1215      History   Chief Complaint Chief Complaint  Patient presents with   Headache    HPI Corey Watkins is a 30 y.o. male.   The history is provided by the patient. No language interpreter was used.  Headache Pain location:  Generalized Radiates to:  Does not radiate Onset quality:  Gradual Timing:  Constant Progression:  Worsening Chronicity:  New Relieved by:  Nothing Worsened by:  Nothing Ineffective treatments:  None tried Associated symptoms: diarrhea and nausea    History reviewed. No pertinent past medical history.  There are no problems to display for this patient.   History reviewed. No pertinent surgical history.  Pt reports he has had 3 episodes of diarrhea today and a headache.     Home Medications    Prior to Admission medications   Medication Sig Start Date End Date Taking? Authorizing Provider  doxycycline (VIBRAMYCIN) 100 MG capsule Take 1 capsule (100 mg total) by mouth 2 (two) times daily. 12/01/20   Mardella Layman, MD    Family History Family History  Problem Relation Age of Onset   Healthy Mother    Healthy Father     Social History Social History   Tobacco Use   Smoking status: Every Day    Pack years: 0.00    Types: Cigarettes   Smokeless tobacco: Never  Substance Use Topics   Alcohol use: Yes    Comment: occ   Drug use: Yes    Types: Marijuana     Allergies   No known allergies   Review of Systems Review of Systems  Gastrointestinal:  Positive for diarrhea and nausea.  Neurological:  Positive for headaches.  All other systems reviewed and are negative.   Physical Exam Triage Vital Signs ED Triage Vitals  Enc Vitals Group     BP 04/22/21 1229 130/64     Pulse Rate 04/22/21 1229 64     Resp 04/22/21 1229 17     Temp 04/22/21 1229 98 F (36.7 C)     Temp Source 04/22/21 1229 Oral     SpO2 04/22/21 1229 99 %     Weight --       Height --      Head Circumference --      Peak Flow --      Pain Score 04/22/21 1228 8     Pain Loc --      Pain Edu? --      Excl. in GC? --    No data found.  Updated Vital Signs BP 130/64 (BP Location: Right Arm)   Pulse 64   Temp 98 F (36.7 C) (Oral)   Resp 17   SpO2 99%   Visual Acuity Right Eye Distance:   Left Eye Distance:   Bilateral Distance:    Right Eye Near:   Left Eye Near:    Bilateral Near:     Physical Exam Vitals and nursing note reviewed.  Constitutional:      Appearance: He is well-developed.  HENT:     Head: Normocephalic and atraumatic.  Eyes:     Conjunctiva/sclera: Conjunctivae normal.  Cardiovascular:     Rate and Rhythm: Normal rate and regular rhythm.     Heart sounds: No murmur heard. Pulmonary:     Effort: Pulmonary effort is normal. No respiratory distress.     Breath sounds: Normal breath sounds.  Abdominal:  Palpations: Abdomen is soft.     Tenderness: There is no abdominal tenderness.  Musculoskeletal:     Cervical back: Neck supple.  Skin:    General: Skin is warm and dry.  Neurological:     Mental Status: He is alert and oriented to person, place, and time.  Psychiatric:        Mood and Affect: Mood normal.     UC Treatments / Results  Labs (all labs ordered are listed, but only abnormal results are displayed) Labs Reviewed  SARS CORONAVIRUS 2 (TAT 6-24 HRS)    EKG   Radiology No results found.  Procedures Procedures (including critical care time)  Medications Ordered in UC Medications - No data to display  Initial Impression / Assessment and Plan / UC Course  I have reviewed the triage vital signs and the nursing notes.  Pertinent labs & imaging results that were available during my care of the patient were reviewed by me and considered in my medical decision making (see chart for details).   MDM:   Final Clinical Impressions(s) / UC Diagnoses   Final diagnoses:  Nonintractable headache,  unspecified chronicity pattern, unspecified headache type  Diarrhea, unspecified type     Discharge Instructions      Take imodium for diarrhea. Tylenol for headache.    ED Prescriptions   None    PDMP not reviewed this encounter. An After Visit Summary was printed and given to the patient.    Elson Areas, PA-C 04/22/21 1329    Elson Areas, [place] 04/22/21 1331

## 2024-03-26 ENCOUNTER — Emergency Department (HOSPITAL_COMMUNITY)
Admission: EM | Admit: 2024-03-26 | Discharge: 2024-03-27 | Discharge status: Against Medical Advice | Attending: Emergency Medicine | Admitting: Emergency Medicine

## 2024-03-26 ENCOUNTER — Encounter (HOSPITAL_COMMUNITY): Payer: Self-pay | Admitting: *Deleted

## 2024-03-26 ENCOUNTER — Other Ambulatory Visit: Payer: Self-pay

## 2024-03-26 DIAGNOSIS — R1012 Left upper quadrant pain: Secondary | ICD-10-CM | POA: Diagnosis present

## 2024-03-26 DIAGNOSIS — R109 Unspecified abdominal pain: Secondary | ICD-10-CM | POA: Diagnosis present

## 2024-03-26 DIAGNOSIS — Z5321 Procedure and treatment not carried out due to patient leaving prior to being seen by health care provider: Secondary | ICD-10-CM | POA: Diagnosis not present

## 2024-03-26 LAB — CBC WITH DIFFERENTIAL/PLATELET
Abs Immature Granulocytes: 0 10*3/uL (ref 0.00–0.07)
Basophils Absolute: 0 10*3/uL (ref 0.0–0.1)
Basophils Relative: 0 %
Eosinophils Absolute: 0 10*3/uL (ref 0.0–0.5)
Eosinophils Relative: 0 %
HCT: 48.4 % (ref 39.0–52.0)
Hemoglobin: 16.6 g/dL (ref 13.0–17.0)
Immature Granulocytes: 0 %
Lymphocytes Relative: 31 %
Lymphs Abs: 1.5 10*3/uL (ref 0.7–4.0)
MCH: 31.9 pg (ref 26.0–34.0)
MCHC: 34.3 g/dL (ref 30.0–36.0)
MCV: 92.9 fL (ref 80.0–100.0)
Monocytes Absolute: 0.3 10*3/uL (ref 0.1–1.0)
Monocytes Relative: 6 %
Neutro Abs: 3 10*3/uL (ref 1.7–7.7)
Neutrophils Relative %: 63 %
Platelets: 254 10*3/uL (ref 150–400)
RBC: 5.21 MIL/uL (ref 4.22–5.81)
RDW: 13 % (ref 11.5–15.5)
WBC: 4.8 10*3/uL (ref 4.0–10.5)
nRBC: 0 % (ref 0.0–0.2)

## 2024-03-26 LAB — URINALYSIS, ROUTINE W REFLEX MICROSCOPIC
Bacteria, UA: NONE SEEN
Bilirubin Urine: NEGATIVE
Glucose, UA: NEGATIVE mg/dL
Hgb urine dipstick: NEGATIVE
Ketones, ur: 5 mg/dL — AB
Leukocytes,Ua: NEGATIVE
Nitrite: NEGATIVE
Protein, ur: 30 mg/dL — AB
Specific Gravity, Urine: 1.026 (ref 1.005–1.030)
pH: 5 (ref 5.0–8.0)

## 2024-03-26 LAB — COMPREHENSIVE METABOLIC PANEL WITH GFR
ALT: 25 U/L (ref 0–44)
AST: 37 U/L (ref 15–41)
Albumin: 4.7 g/dL (ref 3.5–5.0)
Alkaline Phosphatase: 75 U/L (ref 38–126)
Anion gap: 13 (ref 5–15)
BUN: 12 mg/dL (ref 6–20)
CO2: 24 mmol/L (ref 22–32)
Calcium: 10.4 mg/dL — ABNORMAL HIGH (ref 8.9–10.3)
Chloride: 97 mmol/L — ABNORMAL LOW (ref 98–111)
Creatinine, Ser: 1.28 mg/dL — ABNORMAL HIGH (ref 0.61–1.24)
GFR, Estimated: >60 mL/min (ref >60)
Glucose, Bld: 155 mg/dL — ABNORMAL HIGH (ref 70–99)
Potassium: 3.4 mmol/L — ABNORMAL LOW (ref 3.5–5.1)
Sodium: 134 mmol/L — ABNORMAL LOW (ref 135–145)
Total Bilirubin: 0.8 mg/dL (ref 0.0–1.2)
Total Protein: 8.5 g/dL — ABNORMAL HIGH (ref 6.5–8.1)

## 2024-03-26 LAB — LIPASE, BLOOD: Lipase: 30 U/L (ref 11–51)

## 2024-03-26 NOTE — ED Provider Triage Note (Signed)
 Emergency Medicine Provider Triage Evaluation Note  Corey Watkins , a 33 y.o. male  was evaluated in triage.  Pt complains of abd pain and ab protrusion that pushed back into place.  Review of Systems  Positive: Abd pain, "protrusion" after laughing Negative: N/V/D, CP, SHOB  Physical Exam  There were no vitals taken for this visit. Gen:   Awake, no distress   Resp:  Normal effort  MSK:   Moves extremities without difficulty  Other:    Medical Decision Making  Medically screening exam initiated at 8:50 PM.  Appropriate orders placed.  Duwayne Bohland was informed that the remainder of the evaluation will be completed by another provider, this initial triage assessment does not replace that evaluation, and the importance of remaining in the ED until their evaluation is complete.  Labs ordered   Ndrew Creason N, PA-C 03/26/24 2051

## 2024-03-26 NOTE — ED Triage Notes (Signed)
 Abd pain for one hour no n or v

## 2024-03-27 ENCOUNTER — Emergency Department (HOSPITAL_COMMUNITY)
Admission: EM | Admit: 2024-03-27 | Discharge: 2024-03-27 | Discharge status: Against Medical Advice | Source: Home / Self Care

## 2024-03-27 DIAGNOSIS — R1012 Left upper quadrant pain: Secondary | ICD-10-CM | POA: Insufficient documentation

## 2024-03-27 DIAGNOSIS — Z5321 Procedure and treatment not carried out due to patient leaving prior to being seen by health care provider: Secondary | ICD-10-CM | POA: Insufficient documentation

## 2024-03-27 LAB — COMPREHENSIVE METABOLIC PANEL WITH GFR
ALT: 23 U/L (ref 0–44)
AST: 34 U/L (ref 15–41)
Albumin: 4.2 g/dL (ref 3.5–5.0)
Alkaline Phosphatase: 73 U/L (ref 38–126)
Anion gap: 10 (ref 5–15)
BUN: 14 mg/dL (ref 6–20)
CO2: 24 mmol/L (ref 22–32)
Calcium: 9.4 mg/dL (ref 8.9–10.3)
Chloride: 99 mmol/L (ref 98–111)
Creatinine, Ser: 1.48 mg/dL — ABNORMAL HIGH (ref 0.61–1.24)
GFR, Estimated: >60 mL/min (ref >60)
Glucose, Bld: 98 mg/dL (ref 70–99)
Potassium: 4.1 mmol/L (ref 3.5–5.1)
Sodium: 133 mmol/L — ABNORMAL LOW (ref 135–145)
Total Bilirubin: 0.7 mg/dL (ref 0.0–1.2)
Total Protein: 7.7 g/dL (ref 6.5–8.1)

## 2024-03-27 LAB — CBC WITH DIFFERENTIAL/PLATELET
Abs Immature Granulocytes: 0.02 10*3/uL (ref 0.00–0.07)
Basophils Absolute: 0 10*3/uL (ref 0.0–0.1)
Basophils Relative: 0 %
Eosinophils Absolute: 0 10*3/uL (ref 0.0–0.5)
Eosinophils Relative: 0 %
HCT: 47.2 % (ref 39.0–52.0)
Hemoglobin: 16.1 g/dL (ref 13.0–17.0)
Immature Granulocytes: 0 %
Lymphocytes Relative: 33 %
Lymphs Abs: 1.6 10*3/uL (ref 0.7–4.0)
MCH: 31.6 pg (ref 26.0–34.0)
MCHC: 34.1 g/dL (ref 30.0–36.0)
MCV: 92.5 fL (ref 80.0–100.0)
Monocytes Absolute: 0.4 10*3/uL (ref 0.1–1.0)
Monocytes Relative: 7 %
Neutro Abs: 2.9 10*3/uL (ref 1.7–7.7)
Neutrophils Relative %: 60 %
Platelets: 259 10*3/uL (ref 150–400)
RBC: 5.1 MIL/uL (ref 4.22–5.81)
RDW: 12.9 % (ref 11.5–15.5)
WBC: 5 10*3/uL (ref 4.0–10.5)
nRBC: 0 % (ref 0.0–0.2)

## 2024-03-27 LAB — LIPASE, BLOOD: Lipase: 29 U/L (ref 11–51)

## 2024-03-27 NOTE — ED Provider Triage Note (Signed)
 Emergency Medicine Provider Triage Evaluation Note  Sammy Tannen , a 33 y.o. male  was evaluated in triage.   Pt complains of abd pain and ab protrusion that pushed back into place.   Review of Systems  Positive: LUQ pain  Negative: fever  Physical Exam  BP 137/74 (BP Location: Right Arm)   Pulse 86   Temp 99 F (37.2 C) (Oral)   Resp 16   SpO2 99%  Gen:   Awake, no distress   Resp:  Normal effort  MSK:   Moves extremities without difficulty  Other:    Medical Decision Making  Medically screening exam initiated at 2:52 PM.  Appropriate orders placed.  Carina Alire was informed that the remainder of the evaluation will be completed by another provider, this initial triage assessment does not replace that evaluation, and the importance of remaining in the ED until their evaluation is complete.     Tama Fails, PA-C 03/27/24 1455

## 2024-03-27 NOTE — ED Notes (Signed)
 Pt had to leave to go pick up his daughters.

## 2024-03-27 NOTE — ED Notes (Signed)
Pt called for vitals no response x3 ° °

## 2024-03-27 NOTE — ED Triage Notes (Signed)
 Pt states that yesterday while eating he began having L sided abd pain around his umbilicus. Pt states that he felt "a protrusion" in that area and he had to manually reduce it and felt pain relief. Pt states that he also has been increasing workouts recently.
